# Patient Record
Sex: Male | Born: 1965 | Race: White | Hispanic: No | Marital: Single | Smoking: Never smoker
Health system: Southern US, Community
[De-identification: ages and names within clinical notes are randomized; demographics above are authoritative.]

## PROBLEM LIST (undated history)

## (undated) ENCOUNTER — Emergency Department: Payer: Self-pay | Source: Home / Self Care

## (undated) DIAGNOSIS — T1491XA Suicide attempt, initial encounter: Secondary | ICD-10-CM

## (undated) DIAGNOSIS — F319 Bipolar disorder, unspecified: Secondary | ICD-10-CM

## (undated) DIAGNOSIS — I10 Essential (primary) hypertension: Secondary | ICD-10-CM

## (undated) HISTORY — PX: LEG SURGERY: SHX1003

---

## 2004-07-27 ENCOUNTER — Emergency Department: Payer: Self-pay | Admitting: General Practice

## 2005-11-05 ENCOUNTER — Emergency Department: Payer: Self-pay

## 2013-11-21 LAB — CBC
HCT: 53.7 % — AB (ref 40.0–52.0)
HGB: 17.9 g/dL (ref 13.0–18.0)
MCH: 31.8 pg (ref 26.0–34.0)
MCHC: 33.3 g/dL (ref 32.0–36.0)
MCV: 96 fL (ref 80–100)
Platelet: 157 10*3/uL (ref 150–440)
RBC: 5.62 10*6/uL (ref 4.40–5.90)
RDW: 12.9 % (ref 11.5–14.5)
WBC: 7.5 10*3/uL (ref 3.8–10.6)

## 2013-11-21 LAB — COMPREHENSIVE METABOLIC PANEL
ALBUMIN: 4.2 g/dL (ref 3.4–5.0)
AST: 24 U/L (ref 15–37)
Alkaline Phosphatase: 88 U/L
Anion Gap: 6 — ABNORMAL LOW (ref 7–16)
BILIRUBIN TOTAL: 0.6 mg/dL (ref 0.2–1.0)
BUN: 16 mg/dL (ref 7–18)
CALCIUM: 8.7 mg/dL (ref 8.5–10.1)
CO2: 28 mmol/L (ref 21–32)
CREATININE: 0.95 mg/dL (ref 0.60–1.30)
Chloride: 105 mmol/L (ref 98–107)
EGFR (Non-African Amer.): >60
GLUCOSE: 109 mg/dL — AB (ref 65–99)
OSMOLALITY: 279 (ref 275–301)
Potassium: 4.2 mmol/L (ref 3.5–5.1)
SGPT (ALT): 41 U/L
Sodium: 139 mmol/L (ref 136–145)
Total Protein: 7.7 g/dL (ref 6.4–8.2)

## 2013-11-21 LAB — URINALYSIS, COMPLETE
BACTERIA: NONE SEEN
Bilirubin,UR: NEGATIVE
Blood: NEGATIVE
Glucose,UR: NEGATIVE mg/dL (ref 0–75)
KETONE: NEGATIVE
LEUKOCYTE ESTERASE: NEGATIVE
NITRITE: NEGATIVE
PH: 5 (ref 4.5–8.0)
Protein: NEGATIVE
RBC,UR: NONE SEEN /HPF (ref 0–5)
Specific Gravity: 1.024 (ref 1.003–1.030)
Squamous Epithelial: NONE SEEN
WBC UR: NONE SEEN /HPF (ref 0–5)

## 2013-11-21 LAB — DRUG SCREEN, URINE
Amphetamines, Ur Screen: NEGATIVE (ref ?–1000)
Barbiturates, Ur Screen: NEGATIVE (ref ?–200)
Benzodiazepine, Ur Scrn: POSITIVE (ref ?–200)
CANNABINOID 50 NG, UR ~~LOC~~: POSITIVE (ref ?–50)
COCAINE METABOLITE, UR ~~LOC~~: NEGATIVE (ref ?–300)
MDMA (Ecstasy)Ur Screen: NEGATIVE (ref ?–500)
Methadone, Ur Screen: NEGATIVE (ref ?–300)
OPIATE, UR SCREEN: NEGATIVE (ref ?–300)
PHENCYCLIDINE (PCP) UR S: NEGATIVE (ref ?–25)
Tricyclic, Ur Screen: NEGATIVE (ref ?–1000)

## 2013-11-21 LAB — ACETAMINOPHEN LEVEL: Acetaminophen: <2 ug/mL

## 2013-11-21 LAB — SALICYLATE LEVEL: Salicylates, Serum: 2.9 mg/dL — ABNORMAL HIGH

## 2013-11-21 LAB — ETHANOL

## 2013-11-21 LAB — CARBAMAZEPINE LEVEL, TOTAL: CARBAMAZEPINE: 7.2 ug/mL (ref 4.0–12.0)

## 2013-11-22 ENCOUNTER — Inpatient Hospital Stay: Payer: Self-pay | Admitting: Psychiatry

## 2013-11-26 LAB — CARBAMAZEPINE LEVEL, TOTAL: Carbamazepine: 6.8 ug/mL (ref 4.0–12.0)

## 2014-05-31 NOTE — Consult Note (Signed)
PATIENT NAME:  Collin Wilson, [name] MR#:  [id number] DATE OF BIRTH:  [birth year]  DATE OF CONSULTATION:  11/21/2013  REFERRING PHYSICIAN:   CONSULTING PHYSICIAN:  Audery AmelJohn T. Clapacs, MD  IDENTIFYING INFORMATION AND REASON FOR CONSULT: This is a 49 year old man with a history of bipolar disorder brought in and under evaluation because of an alleged suicidal threat on Facebook.    CHIEF COMPLAINT: "Everybody told me I should come in."   HISTORY OF PRESENT ILLNESS:  Information obtained from the patient and the chart. The patient tells me that his life sucks.  It has been sucking for a long time, but has been especially bad for the last 3 years when he lost his last job. It took him a year to find another one and when he did it was at a third of the pay of what he was doing before. He is living with his sister who he does not get along with. His mood is very depressed and has been particularly bad for the last month. He sleeps okay as long as he takes Valium at night. Appetite okay. Endorses suicidal ideation and feelings of hopelessness, no auditory or visual hallucinations. Says that he does take his Tegretol which is prescribed by [facility], but is not taking the rest of his medicines for his other medical problems. Acute stress is just generally hating his job. Denies that he abuses alcohol regularly, but admits he smokes marijuana frequently.   PAST PSYCHIATRIC HISTORY: Long-standing mood disorder. In 2007 he had a suicide attempt.  He characterizes it very clearly as being a successful suicide because he actually did hang himself and required resuscitation. He has been to [facility] at that time, but no further hospitalizations since then. He has been on several medicines in the past, but the Tegretol he thinks was reasonably helpful. He goes to [facility]. He does not describe any euphoric manic episodes.   SOCIAL HISTORY: Not married, no children, Had a  girlfriend, but she broke up with him because of his social situation. Does not get along with his sister, but it is the only place he can afford to live. Works as a Civil Service fast streamerdelivery driver which he finds humiliating and depressing.   PAST MEDICAL HISTORY: Says he has high blood pressure and high cholesterol, but does not take any medicine for it.   SUBSTANCE ABUSE HISTORY: Says he rarely uses alcohol. Denies other substance abuse, but then casually admits that of course that does not include the marijuana that he uses regularly.   FAMILY HISTORY: He says that his sister is a Chartered loss adjusterhoarder, but he does not know of anybody else with real clearly diagnosed psychiatric illness.   CURRENT MEDICATIONS: Tegretol 200 mg twice a day, Valium 5 mg at night at bedtime p.r.n.   ALLERGIES: No known drug allergies.   REVIEW OF SYSTEMS: Depressed mood, irritability, anxiety. Denies acute suicidal intent. Denies hallucinations. The rest of the physical review of systems is currently negative.   MENTAL STATUS EXAMINATION: A somewhat disheveled gentleman who looks his stated age. Passively cooperative. When he does cooperate he tends to be very dramatic in telling stories about how miserable his life is. Eye contact poor. Psychomotor activity slow. Speech is normal rate, tone, and volume. Affect is dramatic. Mood stated as depressed. Thoughts lucid. No loosening of associations or delusions. No evidence of auditory or visual hallucinations. Denies acute suicidal intent, but has chronic hopelessness. No homicidal ideation. He can remember  3 out of 3 objects immediately and at 3 minutes. He is of normal intelligence. He is alert and oriented x 4. Normal fund of knowledge.   VITAL SIGNS: Temperature 97.8, pulse 79, respirations 18, blood pressure 132/88.   LABORATORY RESULTS: Drug screen positive for cannabis and benzodiazepines. Urinalysis negative. Chemistry panel insignificantly elevated glucose at 109. Hematocrit elevated at 53,  otherwise normal CBC.   ASSESSMENT: A 49 year old man with diagnosis of bipolar disorder with long-standing depression. He made what was taken as a serious suicidal threat on Facebook, although he plays down the significance of it. He does have a history of a truly life threatening suicide attempt in the past. Does have multiple symptoms of depression. Requires hospital level treatment.   TREATMENT PLAN: Recommend admission to the psychiatric ward. No bed available at this time. Hopefully there will be a bed available tomorrow for admission. I have recommended that we look into referring him to outside hospitals in the meantime if a bed here is not available. Engage him in appropriate treatment when possible. Continue the Tegretol for now.  I am going to go ahead and file commitment paperwork on him to forestall the possibility of his wanting to walk out at some point given his dangerousness.   DIAGNOSIS PRINCIPAL AND PRIMARY:   AXIS I: Bipolar disorder, depressed.   SECONDARY DIAGNOSES:   AXIS I: No further.   AXIS II: Histrionic features.   AXIS III: History of high blood pressure.     ____________________________ Audery Amel, MD jtc:bu D: 11/21/2013 17:40:27 ET T: 11/21/2013 18:16:08 ET JOB#: [id number]  cc: Audery Amel, MD, <Dictator> Audery Amel MD ELECTRONICALLY SIGNED 11/27/2013 16:16

## 2014-05-31 NOTE — Discharge Summary (Signed)
PATIENT NAME:  Collin Wilson, Irwin A MR#:  [id number] DATE OF BIRTH:  06/16/1965  DATE OF ADMISSION:  11/22/2013 DATE OF DISCHARGE:  11/26/2013  IDENTIFYING INFORMATION:  The patient is a 49 year old Caucasian male who was brought in to the Emergency Department by his father.   CHIEF COMPLAINT: "Everybody thinks I need to come here"   HOSPITAL COURSE:  The patient presented to the Emergency Department after being told by the [facility] Sheriff that the patient needed evaluation. The patient said that he was at work and felt a if life was worthless.  He then put a message on the company's Facebook page suggesting he was depressed and suicidal.  He explained that he had a strain of bad luck. Mainly the reason for his current worsening depression is his income and his current job.  The patient feels bad as he has an associate degree in engineering and the job that he has now is a job that he believes should be given to somebody in high school.  He lost his job after his boss retired and passed the business onto somebody else.  The patient is currently living with his sister who is an alcoholic. The patient has a girlfriend but she recently broke up with him due to his financial problems. The patient did report suicidality at admission and reported symptoms consistent with a major depressive disorder.   The patient also reported a prior suicidal attempt in 2007 where he attempted to hang himself.  The patient actually reported that he was successful and had died.  He stated that his heart stopped beating and that he had a bowel movement during the hanging.  The patient is currently receiving [facility], psychiatric outpatient clinic where he is treated for bipolar disorder.   MEDICATIONS PRIOR TO ADMISSION:   Tegretol 200 mg b.i.d. and diazepam 1 tablet p.o. at bedtime.  In terms of substance abuse, the patient reported use of marijuana. The patient stated that prior to admission, he was  charged with marijuana possession, fictitious tag and he is due to for court soon.  The patient was started on citalopram.  The dose was titrated up to 40 mg p.o. daily as he has a prior history of a mood instability. He was continued on Tegretol 200 mg p.o. b.i.d.    This hospitalization was uneventful. The patient was pleasant and cooperative. He participated in groups.  There were no medical complications during his stay.  On the day of the discharge, the patient denied any suicidality, homicidality or psychosis. The patient denied any side effects from his medications and was no longer voicing suicidality.   DISCHARGE DIAGNOSES:  AXIS I: Major depressive disorder, moderate, recurrent, cannabis use disorder, history of bipolar disorder, hypertension, hypercholesterolemia, gastroesophageal reflux disease.   DISCHARGE MEDICATIONS:  Tegretol 200 mg p.o. b.i.d., citalopram 40 mg p.o. daily, hydrochlorothiazide   25 mg p.o. daily, lisinopril 20 mg p.o. daily, pantoprazole 40 mg p.o. daily, simvastatin 20 mg p.o. daily, trazodone 50 mg p.o. at bedtime for insomnia.      MENTAL STATUS EXAMINATION:  At the time of the discharge, the patient is alert and oriented.  He  displays good hygiene and grooming.  He is wearing a T-shirt and shorts, appears his stated age.  Behavior was calm, pleasant, and cooperative. Eye contact was within normal range. Speech had regular tone, volume, and rate. Thought process is linear and goal directed. Thought content is negative for suicidality, homicidality. Perception negative for  psychosis. Mood is euthymic.  His affect was reactive and bright. Insight is fair.  Judgment is fair.  Patient fund of knowledge appears to be average, attention and concentration appear to be grossly intact; however, there were not formally tested.  Language; the patient was able to follow instructions and repeat sentences without difficulties.   LABORATORY RESULTS:  BUN 16, creatinine 0.95, sodium  139, potassium 4.2. GFR greater than 60, calcium is 8.7. Alcohol was below detection limit. AST 24, ALT 41. Tegretol level 6.8. Urine toxicology screen was positive for benzodiazepines. The patient was prescribed with benzodiazepines prior to admission. Cannabis was also positive in the toxicology screen.  WBC 7.5, hemoglobin is 17.9, hematocrit 53.7. Urinalysis is clear.  Acetaminophen and salicylate levels were not abnormal.   DISCHARGE DISPOSITION:  The patient will be discharged back to home with his family.  DISCHARGE  FOLLOW-UP:  The patient will continue to follow up with [facility] [place]  He has an appointment there on 11/28/2013 at 9:00 a.m., telephone number is [phone].     ____________________________ Jimmy Footman, MD ahg:DT D: 11/26/2013 13:53:00 ET T: 11/26/2013 15:15:31 ET JOB#: [id number]  cc: Jimmy Footman, MD, <Dictator> Horton Chin MD ELECTRONICALLY SIGNED 11/28/2013 16:30
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
## 2015-06-13 ENCOUNTER — Emergency Department
Admission: EM | Admit: 2015-06-13 | Discharge: 2015-06-14 | Disposition: A | Discharge status: Other Acute Inpatient Hospital | Payer: Self-pay | Attending: Student | Admitting: Student

## 2015-06-13 ENCOUNTER — Encounter: Payer: Self-pay | Admitting: Emergency Medicine

## 2015-06-13 DIAGNOSIS — F32A Depression, unspecified: Secondary | ICD-10-CM

## 2015-06-13 DIAGNOSIS — F329 Major depressive disorder, single episode, unspecified: Secondary | ICD-10-CM | POA: Insufficient documentation

## 2015-06-13 DIAGNOSIS — I10 Essential (primary) hypertension: Secondary | ICD-10-CM | POA: Insufficient documentation

## 2015-06-13 DIAGNOSIS — F1290 Cannabis use, unspecified, uncomplicated: Secondary | ICD-10-CM | POA: Insufficient documentation

## 2015-06-13 HISTORY — DX: Suicide attempt, initial encounter: T14.91XA

## 2015-06-13 HISTORY — DX: Bipolar disorder, unspecified: F31.9

## 2015-06-13 HISTORY — DX: Essential (primary) hypertension: I10

## 2015-06-13 LAB — URINE DRUG SCREEN, QUALITATIVE (ARMC ONLY)
AMPHETAMINES, UR SCREEN: NOT DETECTED
BENZODIAZEPINE, UR SCRN: NOT DETECTED
Barbiturates, Ur Screen: NOT DETECTED
Cannabinoid 50 Ng, Ur ~~LOC~~: POSITIVE — AB
Cocaine Metabolite,Ur ~~LOC~~: NOT DETECTED
MDMA (Ecstasy)Ur Screen: NOT DETECTED
METHADONE SCREEN, URINE: NOT DETECTED
OPIATE, UR SCREEN: NOT DETECTED
Phencyclidine (PCP) Ur S: NOT DETECTED
Tricyclic, Ur Screen: NOT DETECTED

## 2015-06-13 LAB — CBC
HEMATOCRIT: 50.6 % (ref 40.0–52.0)
Hemoglobin: 17.8 g/dL (ref 13.0–18.0)
MCH: 32.2 pg (ref 26.0–34.0)
MCHC: 35.1 g/dL (ref 32.0–36.0)
MCV: 91.9 fL (ref 80.0–100.0)
PLATELETS: 178 10*3/uL (ref 150–440)
RBC: 5.51 MIL/uL (ref 4.40–5.90)
RDW: 13 % (ref 11.5–14.5)
WBC: 9.8 10*3/uL (ref 3.8–10.6)

## 2015-06-13 LAB — COMPREHENSIVE METABOLIC PANEL
ALBUMIN: 5.2 g/dL — AB (ref 3.5–5.0)
ALT: 44 U/L (ref 17–63)
ANION GAP: 13 (ref 5–15)
AST: 33 U/L (ref 15–41)
Alkaline Phosphatase: 85 U/L (ref 38–126)
BILIRUBIN TOTAL: 0.9 mg/dL (ref 0.3–1.2)
BUN: 18 mg/dL (ref 6–20)
CO2: 24 mmol/L (ref 22–32)
Calcium: 10 mg/dL (ref 8.9–10.3)
Chloride: 103 mmol/L (ref 101–111)
Creatinine, Ser: 1.07 mg/dL (ref 0.61–1.24)
GFR calc Af Amer: >60 mL/min (ref >60)
GFR calc non Af Amer: >60 mL/min (ref >60)
GLUCOSE: 156 mg/dL — AB (ref 65–99)
POTASSIUM: 3.7 mmol/L (ref 3.5–5.1)
SODIUM: 140 mmol/L (ref 135–145)
TOTAL PROTEIN: 8.4 g/dL — AB (ref 6.5–8.1)

## 2015-06-13 LAB — ETHANOL: Alcohol, Ethyl (B): <5 mg/dL (ref <5)

## 2015-06-13 LAB — SALICYLATE LEVEL: Salicylate Lvl: <4 mg/dL (ref 2.8–30.0)

## 2015-06-13 LAB — ACETAMINOPHEN LEVEL

## 2015-06-13 MED ORDER — ACETAMINOPHEN 500 MG PO TABS
1000.0000 mg | ORAL_TABLET | Freq: Once | ORAL | Status: AC
  Administered 2015-06-13: 1000 mg via ORAL

## 2015-06-13 MED ORDER — ACETAMINOPHEN 500 MG PO TABS
ORAL_TABLET | ORAL | Status: AC
  Administered 2015-06-13: 1000 mg via ORAL
  Filled 2015-06-13: qty 2

## 2015-06-13 MED ORDER — TRAZODONE HCL 50 MG PO TABS
50.0000 mg | ORAL_TABLET | Freq: Every day | ORAL | Status: DC
  Administered 2015-06-13 – 2015-06-14 (×2): 50 mg via ORAL
  Filled 2015-06-13 (×2): qty 1

## 2015-06-13 MED ORDER — TRAMADOL HCL 50 MG PO TABS
ORAL_TABLET | ORAL | Status: DC
Start: 2015-06-13 — End: 2015-06-13
  Filled 2015-06-13: qty 1

## 2015-06-13 MED ORDER — SODIUM CHLORIDE 0.9 % IV BOLUS (SEPSIS)
1000.0000 mL | Freq: Once | INTRAVENOUS | Status: AC
  Administered 2015-06-13: 1000 mL via INTRAVENOUS

## 2015-06-13 NOTE — ED Notes (Signed)
[facility] by Gerilyn PilgrimJacob EDT and Security

## 2015-06-13 NOTE — ED Notes (Signed)
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
## 2015-06-13 NOTE — ED Notes (Signed)
[facility] amb[facility] without difficulty. Patient is alert and oriented and in no apparent distress. Patient denies SI, HI, and AVH. Patient is calm and cooperative. Patient oriented to unit and made aware of security cameras and Q15 minute rounds. Patient encouraged to let Nursing staff know of any concerns or needs.

## 2015-06-13 NOTE — Progress Notes (Signed)
Report received from David, RN
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
## 2015-06-13 NOTE — ED Notes (Signed)
Pt denies any use of drugs and or alcohol at this time. Pt started on CIWA for heart rate elevated due to possible dehydration. Pt denies any shortness of breath and or chest pain. Pt put on cardiac monitor for cont monitoring of heart rate.
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
## 2015-06-13 NOTE — ED Notes (Signed)
Pt denies any thoughts of SI or HI to this RN at this time.

## 2015-06-14 ENCOUNTER — Encounter: Payer: Self-pay | Admitting: Psychiatry

## 2015-06-14 ENCOUNTER — Inpatient Hospital Stay
Admit: 2015-06-14 | Discharge: 2015-06-17 | DRG: 885 | Disposition: A | Discharge status: Home / Self Care | Payer: No Typology Code available for payment source | Source: Intra-hospital | Attending: Psychiatry | Admitting: Psychiatry

## 2015-06-14 DIAGNOSIS — F6089 Other specific personality disorders: Secondary | ICD-10-CM | POA: Diagnosis present

## 2015-06-14 DIAGNOSIS — I10 Essential (primary) hypertension: Secondary | ICD-10-CM

## 2015-06-14 DIAGNOSIS — R45851 Suicidal ideations: Secondary | ICD-10-CM | POA: Diagnosis present

## 2015-06-14 DIAGNOSIS — G4700 Insomnia, unspecified: Secondary | ICD-10-CM | POA: Diagnosis present

## 2015-06-14 DIAGNOSIS — F331 Major depressive disorder, recurrent, moderate: Secondary | ICD-10-CM | POA: Diagnosis present

## 2015-06-14 DIAGNOSIS — F319 Bipolar disorder, unspecified: Secondary | ICD-10-CM | POA: Diagnosis present

## 2015-06-14 DIAGNOSIS — F314 Bipolar disorder, current episode depressed, severe, without psychotic features: Secondary | ICD-10-CM

## 2015-06-14 DIAGNOSIS — F6081 Narcissistic personality disorder: Secondary | ICD-10-CM | POA: Diagnosis present

## 2015-06-14 DIAGNOSIS — F1220 Cannabis dependence, uncomplicated: Secondary | ICD-10-CM

## 2015-06-14 DIAGNOSIS — R296 Repeated falls: Secondary | ICD-10-CM | POA: Diagnosis present

## 2015-06-14 DIAGNOSIS — Z79899 Other long term (current) drug therapy: Secondary | ICD-10-CM

## 2015-06-14 DIAGNOSIS — Z9889 Other specified postprocedural states: Secondary | ICD-10-CM | POA: Diagnosis not present

## 2015-06-14 MED ORDER — IBUPROFEN 800 MG PO TABS
800.0000 mg | ORAL_TABLET | Freq: Once | ORAL | Status: AC
  Administered 2015-06-14: 800 mg via ORAL

## 2015-06-14 MED ORDER — IBUPROFEN 800 MG PO TABS
ORAL_TABLET | ORAL | Status: AC
  Administered 2015-06-14: 800 mg via ORAL
  Filled 2015-06-14: qty 1

## 2015-06-14 MED ORDER — CARBAMAZEPINE 200 MG PO TABS
200.0000 mg | ORAL_TABLET | Freq: Two times a day (BID) | ORAL | Status: DC
  Administered 2015-06-14: 200 mg via ORAL
  Filled 2015-06-14: qty 1

## 2015-06-14 MED ORDER — CITALOPRAM HYDROBROMIDE 20 MG PO TABS
40.0000 mg | ORAL_TABLET | Freq: Every day | ORAL | Status: DC
  Administered 2015-06-14: 40 mg via ORAL
  Filled 2015-06-14: qty 2

## 2015-06-14 NOTE — BH Assessment (Signed)
Per Dr. Jennet MaduroPucilowska patient meets criteria for inpatient hospitalizations.  Writer informed the Charge Nurse that the patient is in need of a bed.

## 2015-06-14 NOTE — ED Notes (Signed)
Patient resting quietly in room. No noted distress or abnormal behaviors noted. Will continue 15 minute checks and observation by security camera for safety. 

## 2015-06-14 NOTE — ED Notes (Addendum)
Patient remains asleep in room in no apparent distress. Patient slept the majority of the shift with no behavioral issues or complaints voiced.Q15 minutes checks and safety maintained.Will continue to monitor.

## 2015-06-14 NOTE — ED Notes (Signed)
Sandwich tray and milk given.

## 2015-06-14 NOTE — ED Notes (Signed)
Patient awake, alert, and oriented. He states he is under stress because of issues living with his mother and it was either "hang myself" or come to the hospital. He currently denies SI. Patient states he has been on Celexa for depression but it "is not working." He is unclear of what he wants for treatment - he "just needs to get a job and get away from my mother."  Maintained on 15 minute checks and observation by security camera for safety.

## 2015-06-14 NOTE — Progress Notes (Signed)
Report given to oncoming shift.
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
## 2015-06-14 NOTE — ED Notes (Signed)
Patient met with psychiatrist.  Maintained on 15 minute checks and observation by security camera for safety. 

## 2015-06-14 NOTE — Progress Notes (Signed)
Report given to Baird Lyonsasey, RN [facility])

## 2015-06-14 NOTE — ED Notes (Signed)
Patient is alert and oriented, warm and dry,and in no apparent distress. Patient.denies SI, HI, and AVH. Patient is currently sitting in his room watching television. Patient encouraged to let nursing staff know of any concerns or needs.

## 2015-06-14 NOTE — ED Notes (Signed)

## 2015-06-14 NOTE — ED Provider Notes (Signed)
-----------------------------------------   7:22 AM on 06/14/2015 -----------------------------------------   Blood pressure 153/93, pulse 106, temperature 98.4 F (36.9 C), temperature source Oral, resp. rate 20, height 6\' 1"  (1.854 m), weight 290 lb (131.543 kg), SpO2 99 %.  The patient had no acute events since last update.  Calm and cooperative at this time.    The patient is still waiting to see psych.     Rebecka ApleyAllison P Veroncia Jezek, MD 06/14/15 [id number]
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
## 2015-06-15 DIAGNOSIS — F1220 Cannabis dependence, uncomplicated: Secondary | ICD-10-CM

## 2015-06-15 DIAGNOSIS — F331 Major depressive disorder, recurrent, moderate: Secondary | ICD-10-CM

## 2015-06-15 DIAGNOSIS — I10 Essential (primary) hypertension: Secondary | ICD-10-CM

## 2015-06-15 DIAGNOSIS — F6089 Other specific personality disorders: Secondary | ICD-10-CM

## 2015-06-15 MED ORDER — CLONIDINE HCL 0.1 MG PO TABS
0.1000 mg | ORAL_TABLET | Freq: Every day | ORAL | Status: DC | PRN
  Administered 2015-06-15: 0.1 mg via ORAL
  Filled 2015-06-15 (×3): qty 1

## 2015-06-15 MED ORDER — TRAZODONE HCL 100 MG PO TABS
100.0000 mg | ORAL_TABLET | Freq: Every day | ORAL | Status: DC
  Administered 2015-06-15 – 2015-06-16 (×2): 100 mg via ORAL
  Filled 2015-06-15 (×4): qty 1

## 2015-06-15 MED ORDER — ATENOLOL 25 MG PO TABS
12.5000 mg | ORAL_TABLET | Freq: Every day | ORAL | Status: DC
  Administered 2015-06-15 – 2015-06-17 (×3): 12.5 mg via ORAL
  Filled 2015-06-15 (×3): qty 1

## 2015-06-15 MED ORDER — ACETAMINOPHEN 325 MG PO TABS: 650.0000 mg | ORAL_TABLET | Freq: Four times a day (QID) | ORAL | Status: DC | PRN

## 2015-06-15 MED ORDER — CLONIDINE HCL 0.1 MG PO TABS
0.1000 mg | ORAL_TABLET | Freq: Once | ORAL | Status: AC
  Administered 2015-06-15: 0.1 mg via ORAL
  Filled 2015-06-15: qty 1

## 2015-06-15 MED ORDER — ALUM & MAG HYDROXIDE-SIMETH 200-200-20 MG/5ML PO SUSP: 30.0000 mL | ORAL | Status: DC | PRN

## 2015-06-15 MED ORDER — ASPIRIN EC 325 MG PO TBEC
325.0000 mg | DELAYED_RELEASE_TABLET | Freq: Four times a day (QID) | ORAL | Status: DC | PRN
  Administered 2015-06-15: 325 mg via ORAL
  Filled 2015-06-15 (×2): qty 1

## 2015-06-15 MED ORDER — CARBAMAZEPINE 200 MG PO TABS
200.0000 mg | ORAL_TABLET | Freq: Two times a day (BID) | ORAL | Status: DC
  Administered 2015-06-15 – 2015-06-17 (×5): 200 mg via ORAL
  Filled 2015-06-15 (×5): qty 1

## 2015-06-15 MED ORDER — MAGNESIUM HYDROXIDE 400 MG/5ML PO SUSP: 30.0000 mL | Freq: Every day | ORAL | Status: DC | PRN

## 2015-06-15 MED ORDER — CITALOPRAM HYDROBROMIDE 20 MG PO TABS
40.0000 mg | ORAL_TABLET | Freq: Every day | ORAL | Status: DC
  Administered 2015-06-15 – 2015-06-17 (×3): 40 mg via ORAL
  Filled 2015-06-15 (×3): qty 2
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
## 2015-06-15 NOTE — Tx Team (Signed)
Initial Interdisciplinary Treatment Plan   PATIENT STRESSORS: Financial difficulties Marital or family conflict Occupational concerns   PATIENT STRENGTHS: General fund of knowledge Motivation for treatment/growth   PROBLEM LIST: Problem List/Patient Goals Date to be addressed Date deferred Reason deferred Estimated date of resolution  Suicidal Ideations  06/15/15     "I knew if I didn't leave I was going to hurt myself." 06/15/15     Hopelessness  06/15/15     Homeless  06/15/15                                    DISCHARGE CRITERIA:  Improved stabilization in mood, thinking, and/or behavior  PRELIMINARY DISCHARGE PLAN: Outpatient therapy  PATIENT/FAMIILY INVOLVEMENT: This treatment plan has been presented to and reviewed with the patient, Collin Wilson, and/or family member.  The patient and family have been given the opportunity to ask questions and make suggestions.  Collin Wilson 06/15/2015, 12:53 AM

## 2015-06-15 NOTE — Plan of Care (Signed)
Problem: Cons[facility] Patient Education Outcome: Progressing Patient cooperates with unit schedule , reinforced the need to go to group  [place]
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
## 2015-06-15 NOTE — Progress Notes (Signed)
Recreation Therapy Notes  Date: 05.08.17 Time: 1:00 pm Location: Craft Room  Group Topic: Wellness  Goal Area(s) Addresses:  Patient will identify at least one item per dimension of health. Patient will examine areas they are deficient in.  Behavioral Response: Attentive, Interactive  Intervention: 6 Dimensions of Health  Activity: Patients were given a definition worksheet on the 6 Dimensions of Health and a worksheet with each dimension on it. Patients were instructed to read over the definition sheet and were encouraged to write 2-3 things they were currently doing in each dimension.  Education: LRT educated group on ways to increase each dimension.  Education Outcome: Acknowledges education/In group clarification offered   Clinical Observations/Feedback: Patient completed activity by writing at least 2 items in each category. Patient contributed to group discussion by stating what area he was giving enough attention to, what area he was not giving enough attention to, some things he could do to increase certain areas, how this activity relates to his admission, and how this activity relates to his admission.  Jacquelynn CreeGreene,Daman Steffenhagen M, LRT/CTRS 06/15/2015 2:35 PM
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
## 2015-06-15 NOTE — Progress Notes (Signed)
D:  Per pt self inventory pt reports sleeping good, appetite good, energy level normal, ability to pay attention good, rates depression at a 2 out of 10, hopelessness at a 5 out of 10, anxiety at a 2 out of 10, denies SI/HI/AVH, flat/depressed during interaction.     A:  Emotional support provided, Encouraged pt to continue with treatment plan and attend all group activities, q15 min checks maintained for safety.  R:  Pt is receptive, going to groups, pleasant and cooperative with staff and other patients on the unit.

## 2015-06-15 NOTE — [facility] Group Notes (Signed)
[facility] Group Notes:  (Nursing/MHT/Case Management/Adjunct)  Date:  06/15/2015  Time:  4:20 PM  Type of Therapy:  Psychoeducational Skills  Participation Level:  Active  Participation Quality:  Attentive  Affect:  Appropriate  Cognitive:  Appropriate  Insight:  Good  Engagement in Group:  Engaged  Modes of Intervention:  Support  Summary of Progress/Problems:  Collin Wilson 06/15/2015, 4:20 PM

## 2015-06-15 NOTE — [facility] Group Notes (Addendum)
[facility] LCSW Group Therapy  06/15/2015 10:55 AM  Type of Therapy:  Group Therapy  Participation Level:  Active  Participation Quality:  Appropriate and Attentive  Affect:  Appropriate  Cognitive:  Alert, Appropriate and Oriented  Insight:  Engaged  Engagement in Therapy:  Engaged and Supportive  Modes of Intervention:  Discussion, Education, Socialization and Support  Summary of Progress/Problems: Patient attended and participated in group discussion appropriately. Patient introduced himself and shared during an introductory exercise that if he were an animal, he would be a "cat because they are mellow, self-sufficient, social when they want to be and alone when they want to be". Patient didn't share much about his own obstacle in group but was able to offer much support to other group members and has much insight and shared his experience using music, meditation, and mindfulness to cope with anger and other negative emotions.   Lulu RidingIngle, Loveah Like T, MSW, LCSW 06/15/2015, 10:55 AM

## 2015-06-15 NOTE — Progress Notes (Signed)
D: Patient is alert and oriented on the unit this shift. Patient does not attend and notactively participated in groups today. Patient denies suicidal ideation, homicidal ideation, auditory or visual hallucinations at the present time.  A: Scheduled medications are administered to patient as per MD orders. Emotional support and encouragement are provided. Patient is maintained on q.15 minute safety checks. Patient is informed to notify staff with questions or concerns. R: No adverse medication reactions are noted. Patient is cooperative with medication administration and treatment plan today. Patient is receptive, calm and cooperative on the unit at this time. Patient does not interact with others on the unit this shift. Patient contracts for safety at this time. Patient remains safe at this time.

## 2015-06-16 MED ORDER — CARBAMAZEPINE 200 MG PO TABS: 200.0000 mg | ORAL_TABLET | Freq: Two times a day (BID) | ORAL | Status: DC

## 2015-06-16 MED ORDER — TRAZODONE HCL 100 MG PO TABS: 100.0000 mg | ORAL_TABLET | Freq: Every day | ORAL | Status: DC

## 2015-06-16 MED ORDER — CITALOPRAM HYDROBROMIDE 40 MG PO TABS: 40.0000 mg | ORAL_TABLET | Freq: Every day | ORAL | Status: DC

## 2015-06-16 NOTE — [facility] Group Notes (Signed)
[facility] Group Notes:  (Nursing/MHT/Case Management/Adjunct)  Date:  06/16/2015  Time:  2:23 PM  Type of Therapy:  Psychoeducational Skills  Participation Level:  Active  Participation Quality:  Appropriate  Affect:  Appropriate  Cognitive:  Appropriate  Insight:  Appropriate  Engagement in Group:  Engaged  Modes of Intervention:  Discussion and Education  Summary of Progress/Problems:  Mickey Farberamela M Moses Ellison 06/16/2015, 2:23 PM
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
## 2015-06-16 NOTE — Progress Notes (Signed)
D: Patient stated slept good last night .Stated appetite is good and energy level  Is normal. Stated concentration is good . Stated on Depression scale 2 , hopeless 5 and anxiety 2 .( low 0-10 high) Denies suicidal  homicidal ideations  .  No auditory hallucinations  No pain concerns . Appropriate ADL'S. Interacting with peers and staff. Patient would not set a goal A: Encourage patient participation with unit programming . Instruction  Given on  Medication , verbalize understanding. R: Voice no other concerns. Staff continue to monitor

## 2015-06-16 NOTE — Plan of Care (Signed)
Problem: [facility] Participation in Recreation Therapeutic Interventions Goal: STG-Patient will demonstrate improved self esteem by identif STG: Self-Esteem - Within 3 treatment sessions, patient will verbalize at least 5 positive affirmation statements in one treatment session to increase self-esteem post d/c.  Outcome: Completed/Met Date Met:  06/16/15 Treatment Session 1; Completed 1 out of 1: At approximately 1:00 pm, LRT met with patient in patient room. Patient verbalized 5 positive affirmation statements. Patient reported it felt "pretty good". LRT encouraged patient to continue saying positive affirmation statements.  Leonette Monarch, LRT/CTRS 05.09.17 2:19 pm Goal: STG-Other Recreation Therapy Goal (Specify) STG: Stress Management - Within 3 treatment sessions, patient will verbalize understanding of the stress management techniques in one treatment session to increase stress management skills post d/c.  Outcome: Completed/Met Date Met:  06/16/15 Treatment Session 1; Completed 1 out of 1: At approximately 1:00 pm, LRT met with patient in patient room. LRT educated and provided patient with handouts on stress management techniques. Patient verbalized understanding. LRT encouraged patient to read over and practice the stress management techniques.  Leonette Monarch, LRT/CTRS 05.09.17 2:22 pm

## 2015-06-16 NOTE — [facility] Group Notes (Signed)
[facility]   06/16/2015 11:33 AM  Participation Quality:  Patient attended and participated in group discussion introducing himself and sharing that his SMART goal is to "plan what I'm gonna do when I get out of the hospital". Patient was given a daily workbook on Recovery and ensured patient was informed of LOS, rules for milieu, roles of staff, and ensured patient was aware of their psychiatrist and social worker names.   Mood/Affect:  Appropriate  Depression Rating:  2  Anxiety Rating:  2  Thoughts of Suicide:  No Will you contract for safety?   Yes  Current AVH:  No  Plan for Discharge/Comments:  Needs to make arrangements  Transportation Means: needs assistance   Lulu RidingIngle, Claudine Stallings T, MSW, LCSW
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
## 2015-06-16 NOTE — Progress Notes (Signed)
Recreation Therapy Notes  Date: 05.09.17 Time: 3:00 pm Location: Craft Room  Group Topic: Self-expression  Goal Area(s) Addresses:  Patient will be able to identify a color that represents each emotion. Patient will verbalize benefit of using art as a means of self-expression. Patient will verbalize one emotion experienced while participating in activity.  Behavioral Response: Attentive, Interactive  Intervention: The Colors Within Me  Activity: Patients were given a blank face worksheet and instructed to pick a color for each emotion they were experiencing and to show on the face how much of that emotion they were feeling.  Education: LRT educated patients on other forms of self-expression.  Education Outcome: Acknowledges education/In group clarification offered   Clinical Observations/Feedback: At the beginning of group, LRT explained the theme of the day was Recovery. Patient got off topic and talked about how his family smokes cigarettes and his sister is an alcoholic. LRT encouraged patient to focus on his own recovery. Patient talked about a movie. Patient completed activity by picking a color for each emotion he was experiencing and showing how much of that emotion he was experiencing. Patient was talked about cars before LRT started group discussion. Patient contributed to group discussion by sharing what emotions he was feeling, that his emotions are dynamic, and that it was helpful to see his emotions on paper.   Jacquelynn CreeGreene,Collin Wilson M, LRT/CTRS 06/16/2015 4:24 PM

## 2015-06-16 NOTE — Progress Notes (Signed)
Recreation Therapy Notes  INPATIENT RECREATION TR PLAN  Patient Details Name: Collin Wilson MRN: [id number] DOB: 1965-12-01 Today's Date: 06/16/2015  Rec Therapy Plan Is patient appropriate for Therapeutic Recreation?: Yes Treatment times per week: At least once a week TR Treatment/Interventions: 1:1 session, Group participation (Comment) (Appropriate participation in daily recreation therapy tx)  Discharge Criteria Pt will be discharged from therapy if:: Treatment goals are met, Discharged Treatment plan/goals/alternatives discussed and agreed upon by:: Patient/family  Discharge Summary Short term goals set: See Care Plan Short term goals met: Complete Progress toward goals comments: One-to-one attended Which groups?: Wellness, Other (Comment) (Self-expression) One-to-one attended: Self-esteem, stress management Reason goals not met: N/A Therapeutic equipment acquired: None Reason patient discharged from therapy: Treatment goals met Pt/family agrees with progress & goals achieved: Yes Date patient discharged from therapy: 06/16/15   Leonette Monarch, LRT/CTRS 06/16/2015, 4:31 PM

## 2015-06-16 NOTE — Plan of Care (Signed)
Problem: Ineffective individual coping Goal: LTG: Patient will report a decrease in negative feelings Outcome: Not Progressing Patient  Attending unit programing

## 2015-06-17 LAB — CARBAMAZEPINE LEVEL, TOTAL: CARBAMAZEPINE LVL: 4.5 ug/mL (ref 4.0–12.0)

## 2015-06-17 MED ORDER — ATENOLOL 25 MG PO TABS: 12.5000 mg | ORAL_TABLET | Freq: Every day | ORAL | Status: DC

## 2015-06-17 NOTE — Plan of Care (Signed)
Problem: Ineffective individual coping Goal: STG: Patient will remain free from self harm Outcome: Progressing Medications administered as ordered by the physician, medications Therapeutic Effects, SEs and Adverse effects discussed, questions encouraged; no PRN given, 15 minute checks maintained for safety, clinical and moral support provided, patient encouraged to continue to express feelings and demonstrate safe care. Patient remain free from harm, will continue to monitor.         

## 2015-06-17 NOTE — [facility] Suicide Risk Assessment (Signed)
[facility]   Principal Problem: Major depressive disorder, recurrent episode, moderate (HCC) Discharge Diagnoses:  Patient Active Problem List   Diagnosis Date Noted  . Major depressive disorder, recurrent episode, moderate (HCC) [F33.1] 06/15/2015  . 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 HTN (hypertension) [I10] 06/15/2015  . Cannabis use disorder, severe, dependence (HCC) [F12.20] 06/15/2015    Total Time spent with patient: 30 minutes    Psychiatric Specialty Exam: ROS                                                         Mental Status Per Nursing Assessment::   On Admission:  Suicidal ideation indicated by patient, Self-harm thoughts  Demographic Factors:  Male, Caucasian, Low socioeconomic status, Living alone and Unemployed  Loss Factors: Financial problems/change in socioeconomic status  Historical Factors: Prior suicide attempts  Risk Reduction Factors:   Positive coping skills or problem solving skills  Continued Clinical Symptoms:  Alcohol/Substance Abuse/Dependencies Personality Disorders:   Cluster B Previous Psychiatric Diagnoses and Treatments  Cognitive Features That Contribute To Risk:  None    Suicide Risk:  Minimal: No identifiable suicidal ideation.  Patients presenting with no risk factors but with morbid ruminations; may be classified as minimal risk based on the severity of the depressive symptoms     Collin Wilson,  Collin Solum, MD 06/17/2015, 7:05 AM
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
## 2015-06-17 NOTE — [facility] Group Notes (Signed)
[facility] LCSW Group Therapy  06/17/2015 11:09 AM  Type of Therapy:  Group Therapy  Participation Level:  Did Not Attend  Summary of Progress/Problems: Patient was called but did not attend morning group.   Stanislawa Gaffin T, MSW, LCSW 06/17/2015, 11:09 AM 

## 2015-06-17 NOTE — [facility] Suicide Risk Assessment (Signed)
[facility] INPATIENT:  Family/Significant Other Suicide Prevention Education  Suicide Prevention Education:  Education Completed; Collin AnisPatricia Oakley, Mother,  (name of family member/significant other) has been identified by the patient as the family member/significant other with whom the patient will be residing, and identified as the person(s) who will aid the patient in the event of a mental health crisis (suicidal ideations/suicide attempt).  With written consent from the patient, the family member/significant other has been provided the following suicide prevention education, prior to the and/or following the discharge of the patient.  The suicide prevention education provided includes the following:  Suicide risk factors  Suicide prevention and interventions  National Suicide Hotline telephone number  [facility] assessment telephone number  [facility] Emergency Assistance 911  [facility] and/or Residential Mobile Crisis Unit telephone number  Request made of family/significant other to:  Remove weapons (e.g., guns, rifles, knives), all items previously/currently identified as safety concern.    Remove drugs/medications (over-the-counter, prescriptions, illicit drugs), all items previously/currently identified as a safety concern.  The family member/significant other verbalizes understanding of the suicide prevention education information provided.  The family member/significant other agrees to remove the items of safety concern listed above.  *Pt will not be returning to live with his mom, she was agreeable to assist him in crisis and to continue to transport him to doctor's appointments so that he remains on medications.  Glennon MacLaws, Castin Donaghue P, MSW, LCSW 06/17/2015, 2:49 PM

## 2015-06-17 NOTE — [facility] Group Notes (Signed)
[facility] LCSW Group Therapy  06/17/2015 2:39 PM  Type of Therapy:  Group Therapy  Participation Level:  Did Not Attend  Summary of Progress/Problems:  CSW called group for a movie, "The Happy Movie" which is a documentary about what truly makes people happy and how to measure happiness. Patient did not attend the afternoon group.   Lulu RidingIngle, Ashni Lonzo T, MSW, LCSW 06/17/2015, 2:39 PM

## 2015-06-17 NOTE — Progress Notes (Signed)
Awake in room, A&Ox3, c/o chronic right leg pain 2/10 "I don't need medication for pain because MJ is the only thing that can relief my pain." Pt denied suicidal thoughts "I just don't want to think right now; Dr. told me that I will be discharge tomorrow whether I like it or not ..." Will continue to monitor.
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
## 2015-06-17 NOTE — Progress Notes (Signed)
  [facility] Adult Case Management Discharge Plan :  Will you be returning to the same living situation after discharge:  No.  PT unable to return to mother, or father.  Can go to sister's but declines to do so.  Message left with Thadea[facility] at [facility]  At discharge, do you have transportation home?: No.  Pt provided Link bus ticket to get to [facility] for intake Do you have the ability to pay for your medications: Yes,  has assistance through [facility]  Release of information consent forms completed and in the chart;  Patient's signature needed at discharge.  Patient to Follow up at: Follow-up Information    [facility] Go on 07/30/2015.   Why:  1:00pm, , Hospital Follow up, Outpatient Medication Management, Therapy, Please reschedule if unable to make appointment.   Contact information:   [address] [place], [place] [address] [phone] Fax: [phone]      Next level of care provider has [facility] Link:no  Safety Planning and Suicide Prevention discussed: Yes,     Have you used any form of tobacco in the last 30 days? (Cigarettes, Smokeless Tobacco, Cigars, and/or Pipes): No  Has patient been referred to the Quitline?: N/A patient is not a smoker  Patient has been referred for addiction treatment: Yes  Isaura Schiller, Cleda DaubSara P, MSW, LCSW 06/17/2015, 2:54 PM

## 2015-06-17 NOTE — Progress Notes (Signed)
D:Patient aware of discharge this shift . Patient returning home . Patient received all belonging locked up . Patient denies  Suicidal  And homicidal ideations  .  A: Writer instructed on discharge criteria  . Informed of   AVS, Transitional Record, Suicidal  Risk Assessment and prescriptions  given to patient . Aware  Of follow up appointment . R: Patient left unit with no questions  Or concerns  To bus stop
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
## 2015-06-22 ENCOUNTER — Ambulatory Visit: Payer: No Typology Code available for payment source

## 2016-12-28 ENCOUNTER — Telehealth: Payer: Self-pay | Admitting: Pharmacy Technician

## 2016-12-28 NOTE — Telephone Encounter (Signed)
Patient failed to provide current poi for 2018.  No additional medication assistance will be provided by [facility] without the required proof of income documentation.  Patient notified by letter.  Betty J. Kluttz Care Manager [facility] 

## 2019-05-31 ENCOUNTER — Other Ambulatory Visit: Payer: Self-pay

## 2019-05-31 ENCOUNTER — Ambulatory Visit: Payer: Self-pay | Attending: Internal Medicine

## 2019-05-31 DIAGNOSIS — Z23 Encounter for immunization: Secondary | ICD-10-CM

## 2019-05-31 NOTE — Progress Notes (Signed)
   Covid-19 Vaccination Clinic  Name:  ZALAN SHIDLER    MRN: [id number] DOB: 11-16-1965  05/31/2019  Mr. Randle was observed post Covid-19 immunization for 15 minutes without incident. He was provided with Vaccine Information Sheet and instruction to access the V-Safe system.   Mr. Hegner was instructed to call 911 with any severe reactions post vaccine: Marland Kitchen Difficulty breathing  . Swelling of face and throat  . A fast heartbeat  . A bad rash all over body  . Dizziness and weakness   Immunizations Administered    Name Date Dose VIS Date Route   Pfizer COVID-19 Vaccine 05/31/2019 11:29 AM 0.3 mL 04/03/2018 Intramuscular   Manufacturer: [employer], [employer]   Lot: [id number]   NDC: [address]-2

## 2019-06-25 ENCOUNTER — Ambulatory Visit: Payer: Self-pay | Attending: Internal Medicine

## 2020-04-10 ENCOUNTER — Other Ambulatory Visit: Payer: Self-pay

## 2020-04-10 ENCOUNTER — Emergency Department
Admission: EM | Admit: 2020-04-10 | Discharge: 2020-04-10 | Disposition: A | Discharge status: Home / Self Care | Payer: Self-pay | Attending: Emergency Medicine | Admitting: Emergency Medicine

## 2020-04-10 ENCOUNTER — Emergency Department: Payer: Self-pay

## 2020-04-10 DIAGNOSIS — K219 Gastro-esophageal reflux disease without esophagitis: Secondary | ICD-10-CM | POA: Insufficient documentation

## 2020-04-10 DIAGNOSIS — I10 Essential (primary) hypertension: Secondary | ICD-10-CM | POA: Insufficient documentation

## 2020-04-10 DIAGNOSIS — R0789 Other chest pain: Secondary | ICD-10-CM | POA: Insufficient documentation

## 2020-04-10 LAB — CBC
HCT: 45.1 % (ref 39.0–52.0)
Hemoglobin: 15.8 g/dL (ref 13.0–17.0)
MCH: 32 pg (ref 26.0–34.0)
MCHC: 35 g/dL (ref 30.0–36.0)
MCV: 91.3 fL (ref 80.0–100.0)
Platelets: 151 10*3/uL (ref 150–400)
RBC: 4.94 MIL/uL (ref 4.22–5.81)
RDW: 12.7 % (ref 11.5–15.5)
WBC: 6.9 10*3/uL (ref 4.0–10.5)
nRBC: 0 % (ref 0.0–0.2)

## 2020-04-10 LAB — BASIC METABOLIC PANEL
Anion gap: 8 (ref 5–15)
BUN: 18 mg/dL (ref 6–20)
CO2: 27 mmol/L (ref 22–32)
Calcium: 8.8 mg/dL — ABNORMAL LOW (ref 8.9–10.3)
Chloride: 105 mmol/L (ref 98–111)
Creatinine, Ser: 0.76 mg/dL (ref 0.61–1.24)
GFR, Estimated: >60 mL/min (ref >60)
Glucose, Bld: 115 mg/dL — ABNORMAL HIGH (ref 70–99)
Potassium: 4 mmol/L (ref 3.5–5.1)
Sodium: 140 mmol/L (ref 135–145)

## 2020-04-10 LAB — TROPONIN I (HIGH SENSITIVITY)
Troponin I (High Sensitivity): 5 ng/L (ref <18)
Troponin I (High Sensitivity): 5 ng/L (ref <18)

## 2020-04-10 MED ORDER — FAMOTIDINE 20 MG PO TABS
20.0000 mg | ORAL_TABLET | Freq: Once | ORAL | Status: AC
  Administered 2020-04-10: 20 mg via ORAL
  Filled 2020-04-10: qty 1

## 2020-04-10 MED ORDER — LIDOCAINE VISCOUS HCL 2 % MT SOLN
15.0000 mL | Freq: Once | OROMUCOSAL | Status: AC
  Administered 2020-04-10: 15 mL via OROMUCOSAL
  Filled 2020-04-10: qty 15

## 2020-04-10 MED ORDER — ALUM & MAG HYDROXIDE-SIMETH 200-200-20 MG/5ML PO SUSP
30.0000 mL | Freq: Once | ORAL | Status: AC
  Administered 2020-04-10: 30 mL via ORAL
  Filled 2020-04-10: qty 30

## 2020-04-10 MED ORDER — OMEPRAZOLE 40 MG PO CPDR: 40.0000 mg | DELAYED_RELEASE_CAPSULE | Freq: Every day | ORAL | 0 refills | Status: DC

## 2020-04-10 MED ORDER — SUCRALFATE 1 G PO TABS: 1.0000 g | ORAL_TABLET | Freq: Four times a day (QID) | ORAL | 0 refills | Status: DC | PRN

## 2020-04-10 NOTE — ED Provider Notes (Signed)
[facility] Emergency Department Provider Note ____________________________________________   Event Date/Time   First MD Initiated Contact with Patient 04/10/20 1010     (approximate)  I have reviewed the triage vital signs and the nursing notes.   HISTORY  Chief Complaint Chest Pain and Abdominal Pain    HPI Collin Wilson is a 55 y.o. male with PMH as noted below including hypertension but no prior cardiac history who presents with chest pain, acute onset around 3 AM, intermittent since then, described as a squeezing sensation.  It is mainly substernal.  It is associated with nausea.  The patient states that he chewed an aspirin and took a nitroglycerin he got from his stepfather.  The pain improved somewhat after this but then returned.  He states that he has had similar chest pain once in the past which he was told was related to hiatal hernia.  Past Medical History:  Diagnosis Date  . Bipolar 1 disorder (HCC)   . Hypertension   . Suicide attempt [facility])     Patient Active Problem List   Diagnosis Date Noted  . Major depressive disorder, recurrent episode, moderate (HCC) 06/15/2015  . Cluster B personality disorder (HCC) 06/15/2015  . HTN (hypertension) 06/15/2015  . Cannabis use disorder, severe, dependence (HCC) 06/15/2015    Past Surgical History:  Procedure Laterality Date  . LEG SURGERY      Prior to Admission medications   Medication Sig Start Date End Date Taking? Authorizing Provider  atenolol (TENORMIN) 25 MG tablet Take 0.5 tablets (12.5 mg total) by mouth daily. 06/17/15   Jimmy Footman, MD  carbamazepine (TEGRETOL) 200 MG tablet Take 1 tablet (200 mg total) by mouth 2 (two) times daily at 8 am and 10 pm. 06/16/15   Jimmy Footman, MD  citalopram (CELEXA) 40 MG tablet Take 1 tablet (40 mg total) by mouth daily. 06/16/15   Jimmy Footman, MD  traZODone (DESYREL) 100 MG tablet Take 1 tablet (100 mg  total) by mouth at bedtime. 06/16/15   Jimmy Footman, MD    Allergies Patient has no known allergies.  History reviewed. No pertinent family history.  Social History Social History   Tobacco Use  . Smoking status: Never Smoker  . Smokeless tobacco: Never Used  Substance Use Topics  . Alcohol use: No  . Drug use: Yes    Types: Marijuana    Review of Systems  Constitutional: No fever/chills. Eyes: No visual changes. ENT: No sore throat. Cardiovascular: Positive for chest pain. Respiratory: Denies shortness of breath. Gastrointestinal: Positive for nausea. Genitourinary: Negative for dysuria.  Musculoskeletal: Negative for back pain. Skin: Negative for rash. Neurological: Negative for headache.   ____________________________________________   PHYSICAL EXAM:  VITAL SIGNS: ED Triage Vitals  Enc Vitals Group     BP --      Pulse Rate 04/10/20 1004 81     Resp 04/10/20 1004 13     Temp 04/10/20 1004 98.9 F (37.2 C)     Temp Source 04/10/20 1004 Oral     SpO2 04/10/20 1004 97 %     Weight 04/10/20 1005 290 lb (131.5 kg)     Height 04/10/20 1005 6\' 2"  (1.88 m)     Head Circumference --      Peak Flow --      Pain Score 04/10/20 1005 4     Pain Loc --      Pain Edu? --      Excl. in GC? --  Constitutional: Alert and oriented. Well appearing and in no acute distress. Eyes: Conjunctivae are normal.  Head: Atraumatic. Nose: No congestion/rhinnorhea. Mouth/Throat: Mucous membranes are moist.   Neck: Normal range of motion.  Cardiovascular: Normal rate, regular rhythm. Grossly normal heart sounds.  Good peripheral circulation. Respiratory: Normal respiratory effort.  No retractions. Lungs CTAB. Gastrointestinal: Soft and nontender. No distention.  Musculoskeletal: No lower extremity edema.  Extremities warm and well perfused.  Neurologic:  Normal speech and language. No gross focal neurologic deficits are appreciated.  Skin:  Skin is warm and dry.  No rash noted. Psychiatric: Mood and affect are normal. Speech and behavior are normal.  ____________________________________________   LABS (all labs ordered are listed, but only abnormal results are displayed)  Labs Reviewed  BASIC METABOLIC PANEL - Abnormal; Notable for the following components:      Result Value   Glucose, Bld 115 (*)    Calcium 8.8 (*)    All other components within normal limits  CBC  TROPONIN I (HIGH SENSITIVITY)  TROPONIN I (HIGH SENSITIVITY)   ____________________________________________  EKG  ED ECG REPORT I, Dionne Bucy, the attending physician, personally viewed and interpreted this ECG.  Date: 04/10/2020 EKG Time: 0959 Rate: 82 Rhythm: normal sinus rhythm QRS Axis: normal Intervals: normal ST/T Wave abnormalities: normal Narrative Interpretation: no evidence of acute ischemia  ____________________________________________  RADIOLOGY  Chest x-ray interpreted by me shows no focal infiltrate or edema  ____________________________________________   PROCEDURES  Procedure(s) performed: No  Procedures  Critical Care performed: No ____________________________________________   INITIAL IMPRESSION / ASSESSMENT AND PLAN / ED COURSE  Pertinent labs & imaging results that were available during my care of the patient were reviewed by me and considered in my medical decision making (see chart for details).  55 year old male with PMH as noted above presents with intermittent substernal chest pain which awoke him from sleep and is atypical and nonexertional.  He reports similar pain once previously which he was told was related to a hiatal hernia.  He has hypertension but no known cardiac history.  On exam, the patient is overall well-appearing.  His vital signs are normal except for hypertension.  The physical exam is otherwise unremarkable for acute findings.  Overall presentation is most consistent with GERD versus musculoskeletal  pain or other benign etiology.  I have a low suspicion for ACS.  The patient's EKG is nonischemic (initial EKG was read by the machine as acute MI, however this was due to poor quality baseline; repeat done moments later showed no acute abnormality).  The presentation is not consistent with PE, aortic dissection or other vascular cause given the reassuring vital signs and intermittent nature of the pain.  He is status post cholecystectomy.  The pain is all in his chest rather than his abdomen so I do not suspect hepatobiliary cause.  We will obtain basic labs, cardiac enzymes x2, chest x-ray, and reassess.  I will also give a GI cocktail.  ----------------------------------------- 2:08 PM on 04/10/2020 -----------------------------------------  The patient reports significantly improved pain after the GI meds.  He has relatively mild residual pain but appears comfortable.  Overall the presentation is most consistent with reflux given his history of hiatal hernia and prior episodes of similar pain.  He is not currently on any medications for GERD.  At this time, the patient is stable for discharge.  I counseled him on the results of the work-up.  Return precautions given, and he expresses understanding.  ____________________________________________   FINAL  CLINICAL IMPRESSION(S) / ED DIAGNOSES  Final diagnoses:  Atypical chest pain  Gastroesophageal reflux disease, unspecified whether esophagitis present      NEW MEDICATIONS STARTED DURING THIS VISIT:  New Prescriptions   No medications on file     Note:  This document was prepared using Dragon voice recognition software and may include unintentional dictation errors.   Dionne Bucy, MD 04/10/20 1410

## 2020-04-10 NOTE — ED Triage Notes (Signed)
pt arrives via ems from home. c/o CP x 7 hours starting at 0300, waking pt from sleep. reports pain substernal, non radiating. sharp stabing 8/10 at 0300. pt took 325mg  asprin and went back to sleep. 0600 took another 325 asprin and one sublingual nitro. pt called ems for 2/10 CP pain, non radiating, sharp stabing. pains similar to this since having gallbladder removed. Pt also c/o right upper abd pain, tender with palpation, back pain that increases with deep breathing between shoulder blades. Pt last visit to PCP was 3 months ago. nitro taken by pt was fathers medication and not pts. Pt a&o x 4 on arrival NAD noted.     ems vitals  cbg 130 t-98.7 BP 173/92 HR 84  97% RA

## 2020-04-10 NOTE — Discharge Instructions (Addendum)
Take the Prilosec daily as prescribed at least for the next several weeks.  You may take the Carafate if needed on top of this if you have an episode of the pain especially after eating.  Return to the ER for new, worsening, or persistent severe pain, nausea or vomiting, difficulty breathing, weakness or lightheadedness, or any other new or worsening symptoms that concern you.  Follow-up with your primary care doctor in the next few weeks.

## 2020-07-03 ENCOUNTER — Other Ambulatory Visit: Payer: Self-pay

## 2020-07-09 ENCOUNTER — Other Ambulatory Visit: Payer: Self-pay

## 2020-07-10 ENCOUNTER — Other Ambulatory Visit: Payer: Self-pay

## 2020-07-10 MED ORDER — CITALOPRAM HYDROBROMIDE 40 MG PO TABS
ORAL_TABLET | ORAL | 0 refills | Status: DC
  Filled 2020-07-10: qty 30, 30d supply, fill #0

## 2020-07-10 MED ORDER — CARBAMAZEPINE 200 MG PO TABS
ORAL_TABLET | ORAL | 0 refills | Status: DC
  Filled 2020-07-10: qty 90, 30d supply, fill #0

## 2020-07-22 ENCOUNTER — Other Ambulatory Visit: Payer: Self-pay

## 2020-07-22 ENCOUNTER — Ambulatory Visit: Payer: Medicaid Other | Admitting: Pharmacy Technician

## 2020-07-22 DIAGNOSIS — Z79899 Other long term (current) drug therapy: Secondary | ICD-10-CM

## 2020-07-22 NOTE — Progress Notes (Signed)
Completed [facility] application and contract.  Patient agreed to all terms of the [facility] contract.    Patient approved to receive medication assistance at [facility] unitl time for re-certification, and as long as eligibility criteria continues to be met.    Provided patient with Civil engineer, contracting based on his particular needs.    [facility]

## 2020-08-03 ENCOUNTER — Other Ambulatory Visit: Payer: Self-pay

## 2020-08-11 ENCOUNTER — Other Ambulatory Visit: Payer: Self-pay

## 2020-08-11 MED ORDER — CARBAMAZEPINE 200 MG PO TABS
ORAL_TABLET | ORAL | 0 refills | Status: DC
  Filled 2020-08-11: qty 60, 20d supply, fill #0
  Filled 2020-08-26: qty 30, 10d supply, fill #1
  Filled ????-??-??: fill #1

## 2020-08-11 MED ORDER — CITALOPRAM HYDROBROMIDE 40 MG PO TABS
ORAL_TABLET | ORAL | 0 refills | Status: DC
  Filled 2020-08-11: qty 30, 30d supply, fill #0

## 2020-08-12 ENCOUNTER — Other Ambulatory Visit: Payer: Self-pay

## 2020-08-12 MED ORDER — CITALOPRAM HYDROBROMIDE 40 MG PO TABS
ORAL_TABLET | ORAL | 2 refills | Status: DC
  Filled 2020-09-09: qty 30, 30d supply, fill #0
  Filled 2020-10-08: qty 30, 30d supply, fill #1
  Filled 2020-11-18: qty 30, 30d supply, fill #2

## 2020-08-12 MED ORDER — CARBAMAZEPINE 200 MG PO TABS
ORAL_TABLET | ORAL | 2 refills | Status: DC
  Filled 2020-09-09: qty 90, 30d supply, fill #0
  Filled 2020-10-08: qty 90, 30d supply, fill #1
  Filled 2020-11-18: qty 90, 30d supply, fill #2

## 2020-08-13 ENCOUNTER — Other Ambulatory Visit: Payer: Self-pay

## 2020-08-25 ENCOUNTER — Ambulatory Visit: Payer: Medicaid Other | Admitting: Pharmacist

## 2020-08-25 ENCOUNTER — Encounter (INDEPENDENT_AMBULATORY_CARE_PROVIDER_SITE_OTHER): Payer: Self-pay

## 2020-08-25 ENCOUNTER — Encounter: Payer: Self-pay | Admitting: Pharmacist

## 2020-08-25 ENCOUNTER — Other Ambulatory Visit: Payer: Self-pay

## 2020-08-25 DIAGNOSIS — Z79899 Other long term (current) drug therapy: Secondary | ICD-10-CM

## 2020-08-25 NOTE — Progress Notes (Addendum)
[facility] Visit Note  Patient: Collin Wilson MRN: [id number] Date of Birth: 08/29/1965 PCP: [facility]   Zada Finders 55 y.o. male presents in good spirits for an initial medication therapy management visit today via telephone. Pt was verified by name and DOB.   There were no vitals taken for this visit.  Patient Information   Past Medical History:  Diagnosis Date   Bipolar 1 disorder (HCC)    Hypertension    Suicide attempt [facility])       Past Surgical History:  Procedure Laterality Date   LEG SURGERY      History reviewed. No pertinent family history.  New Diagnoses (since last visit):   Lifestyle Diet: Watches what he eats due to PMH of diverticulosis.  Exercise: was not evaluated at today's visit. Followed by Physical Therapy and states pain levels range depending on factors such as the position he sleeps in, etc. Pt has chronic hip and knee pain.   Social History   Substance and Sexual Activity  Alcohol Use No      Social History   Tobacco Use  Smoking Status Never  Smokeless Tobacco Never      Health Maintenance  Topic Date Due   Pneumococcal Vaccine 82-71 Years old (1 - PCV) Never done   HIV Screening  Never done   Hepatitis C Screening  Never done   Zoster Vaccines- Shingrix (1 of 2) Never done   COLONOSCOPY (Pts 45-39yrs Insurance coverage will need to be confirmed)  Never done   COVID-19 Vaccine (3 - Pfizer risk series) 08/24/2020   INFLUENZA VACCINE  09/07/2020   TETANUS/TDAP  06/02/2023   HPV VACCINES  Aged Out   Health Maintenance/Date Completed  Last ED visit: 07/01/2020 - COVID-19 infection Last Visit to PCP: 07/22/20 Next Visit to PCP: 11/24/2020 Dr. Payton Mccallum  Specialist Visit: last visit was 07/27/20 with physical therapy - Verneita Griffes  Dental Exam: not recently  Eye Exam: no but would like to see the eye doctor, would like a referral Prostate Exam: not recent  Colonoscopy:  unsure  Flu Vaccine: not sure  Pneumonia Vaccine: never  COVID-19 Vaccine: 07/27/20 2 doses of pfizer  Shingrix Vaccine: not sure   Outpatient Encounter Medications as of 08/25/2020  Medication Sig   atorvastatin (LIPITOR) 10 MG tablet Take 10 mg by mouth daily. Take 1 tablet by mouth daily in the morning   [START ON 09/04/2020] carbamazepine (TEGRETOL) 200 MG tablet TAKE ONE TABLET BY MOUTH THREE TIMES DAILY.   [START ON 09/04/2020] citalopram (CELEXA) 40 MG tablet TAKE ONE TABLET BY MOUTH ONCE DAILY IN THE MORNING.   lisinopril (ZESTRIL) 20 MG tablet Take 20 mg by mouth daily.   [DISCONTINUED] carbamazepine (TEGRETOL) 200 MG tablet TAKE ONE TABLET BY MOUTH THREE TIMES DAILY.   [DISCONTINUED] atenolol (TENORMIN) 25 MG tablet Take 0.5 tablets (12.5 mg total) by mouth daily. (Patient not taking: Reported on 08/25/2020)   [DISCONTINUED] carbamazepine (TEGRETOL) 200 MG tablet Take 1 tablet (200 mg total) by mouth 2 (two) times daily at 8 am and 10 pm. (Patient not taking: Reported on 08/25/2020)   [DISCONTINUED] carbamazepine (TEGRETOL) 200 MG tablet TAKE ONE TABLET (200MG  TOTAL) BY MOUTH THREE TIMES DAILY.   [DISCONTINUED] citalopram (CELEXA) 40 MG tablet Take 1 tablet (40 mg total) by mouth daily. (Patient not taking: Reported on 08/25/2020)   [DISCONTINUED] citalopram (CELEXA) 40 MG tablet TAKE ONE TABLET (40MG  TOTAL) BY MOUTH ONCE DAILY IN THE MORNING.   [  DISCONTINUED] citalopram (CELEXA) 40 MG tablet TAKE ONE TABLET BY MOUTH ONCE DAILY IN THE MORNING. (Patient not taking: Reported on 08/25/2020)   [DISCONTINUED] omeprazole (PRILOSEC) 40 MG capsule Take 1 capsule (40 mg total) by mouth daily.   [DISCONTINUED] sucralfate (CARAFATE) 1 g tablet Take 1 tablet (1 g total) by mouth 4 (four) times daily as needed (reflux).   [DISCONTINUED] traZODone (DESYREL) 100 MG tablet Take 1 tablet (100 mg total) by mouth at bedtime. (Patient not taking: Reported on 08/25/2020)   No facility-administered encounter  medications on file as of 08/25/2020.    Assessment and Plan:  Medication Management/Adherence:  Pt reports adherence to medications. Only concern for today is a refill from [facility] for Carbamazepine. Medication needs are being taken care of by [facility] and [facility] via charity care.   Mental Health:  Current regimen consists of citalopram and carbamazepine. Pt is knowledgeable and appears to be invested in his mental health. Pt is receiving medication from [facility]. Followed by: [facility] per pt.   Hypertension:  Currently on lisinopril. Pt reports he checks BP at home when he feels bad. BP ranges: 140s/100s. States dizziness and headaches have improved since starting medication. Pt endorses he would like a referral for opthalmology from PCP. Goal BP <130/80 mmHg per Dr.Conty is consistent with HTN guidelines. Pt is receiving medication from [facility] through charity care. Followed by: Payton Mccallum, MD   Hyperlipidemia:  Currently on atorvastatin. Lipid panel on 07/20/20: LDL 145, TG 185, Chol 227. Pt is receiving medication from [facility] through charity care. Followed by: Payton Mccallum, MD.    RTC: 1 year  M. [place] Khamron Gellert, [place] PharmD Candidate [phone]  [employer]  Cosigned: Iona Beard. Joelene Millin, PharmD [facility]-Pharmacy Operations Coordinator [phone]

## 2020-08-26 ENCOUNTER — Other Ambulatory Visit: Payer: Self-pay

## 2020-08-28 ENCOUNTER — Other Ambulatory Visit: Payer: Self-pay

## 2020-09-09 ENCOUNTER — Other Ambulatory Visit: Payer: Self-pay

## 2020-09-21 ENCOUNTER — Other Ambulatory Visit: Payer: Self-pay

## 2020-10-09 ENCOUNTER — Other Ambulatory Visit: Payer: Self-pay

## 2020-11-17 ENCOUNTER — Other Ambulatory Visit: Payer: Self-pay

## 2020-11-18 ENCOUNTER — Other Ambulatory Visit: Payer: Self-pay

## 2020-11-18 MED ORDER — CARBAMAZEPINE 200 MG PO TABS
ORAL_TABLET | ORAL | 2 refills | Status: DC
  Filled 2021-01-08: qty 90, 30d supply, fill #0
  Filled 2021-02-10: qty 90, 30d supply, fill #1

## 2020-11-18 MED ORDER — CITALOPRAM HYDROBROMIDE 40 MG PO TABS
ORAL_TABLET | ORAL | 2 refills | Status: DC
  Filled 2021-01-07: qty 30, 30d supply, fill #0
  Filled 2021-02-10: qty 30, 30d supply, fill #1

## 2021-01-07 ENCOUNTER — Other Ambulatory Visit: Payer: Self-pay

## 2021-01-08 ENCOUNTER — Other Ambulatory Visit: Payer: Self-pay

## 2021-01-11 ENCOUNTER — Other Ambulatory Visit: Payer: Self-pay

## 2021-02-10 ENCOUNTER — Other Ambulatory Visit: Payer: Self-pay

## 2021-02-10 MED ORDER — CARBAMAZEPINE 200 MG PO TABS
ORAL_TABLET | ORAL | 2 refills | Status: AC
  Filled 2021-02-10: qty 90, 30d supply, fill #0

## 2021-02-10 MED ORDER — CITALOPRAM HYDROBROMIDE 40 MG PO TABS
ORAL_TABLET | ORAL | 2 refills | Status: AC
  Filled 2021-02-10: qty 30, 30d supply, fill #0

## 2021-02-15 ENCOUNTER — Telehealth: Payer: Self-pay | Admitting: Pharmacy Technician

## 2021-02-15 NOTE — Telephone Encounter (Signed)
Patient stated that he will have a Medicare Part D plan beginning 03/10/21.  Explained that patient would no longer be eligible to receive medication assistance.  Patient acknowledged that he understood.  Patient has not decided on a pharmacy that he plans to use.  Patient will call [facility] to inform where to transfer prescriptions once he decides where he wants to fill his prescriptions.  Sherilyn Dacosta Care Manager [facility]

## 2021-02-16 ENCOUNTER — Other Ambulatory Visit: Payer: Self-pay

## 2021-02-18 ENCOUNTER — Other Ambulatory Visit: Payer: Self-pay

## 2021-03-18 ENCOUNTER — Other Ambulatory Visit: Payer: Self-pay

## 2021-03-23 ENCOUNTER — Other Ambulatory Visit: Payer: Self-pay

## 2023-02-22 IMAGING — CR DG CHEST 2V
2 series · 2 of 2 positions shown · non-contrast
Comparison: 11/05/2005

CLINICAL DATA: Chest pain

EXAM:
CHEST - 2 VIEW

[chest pa]
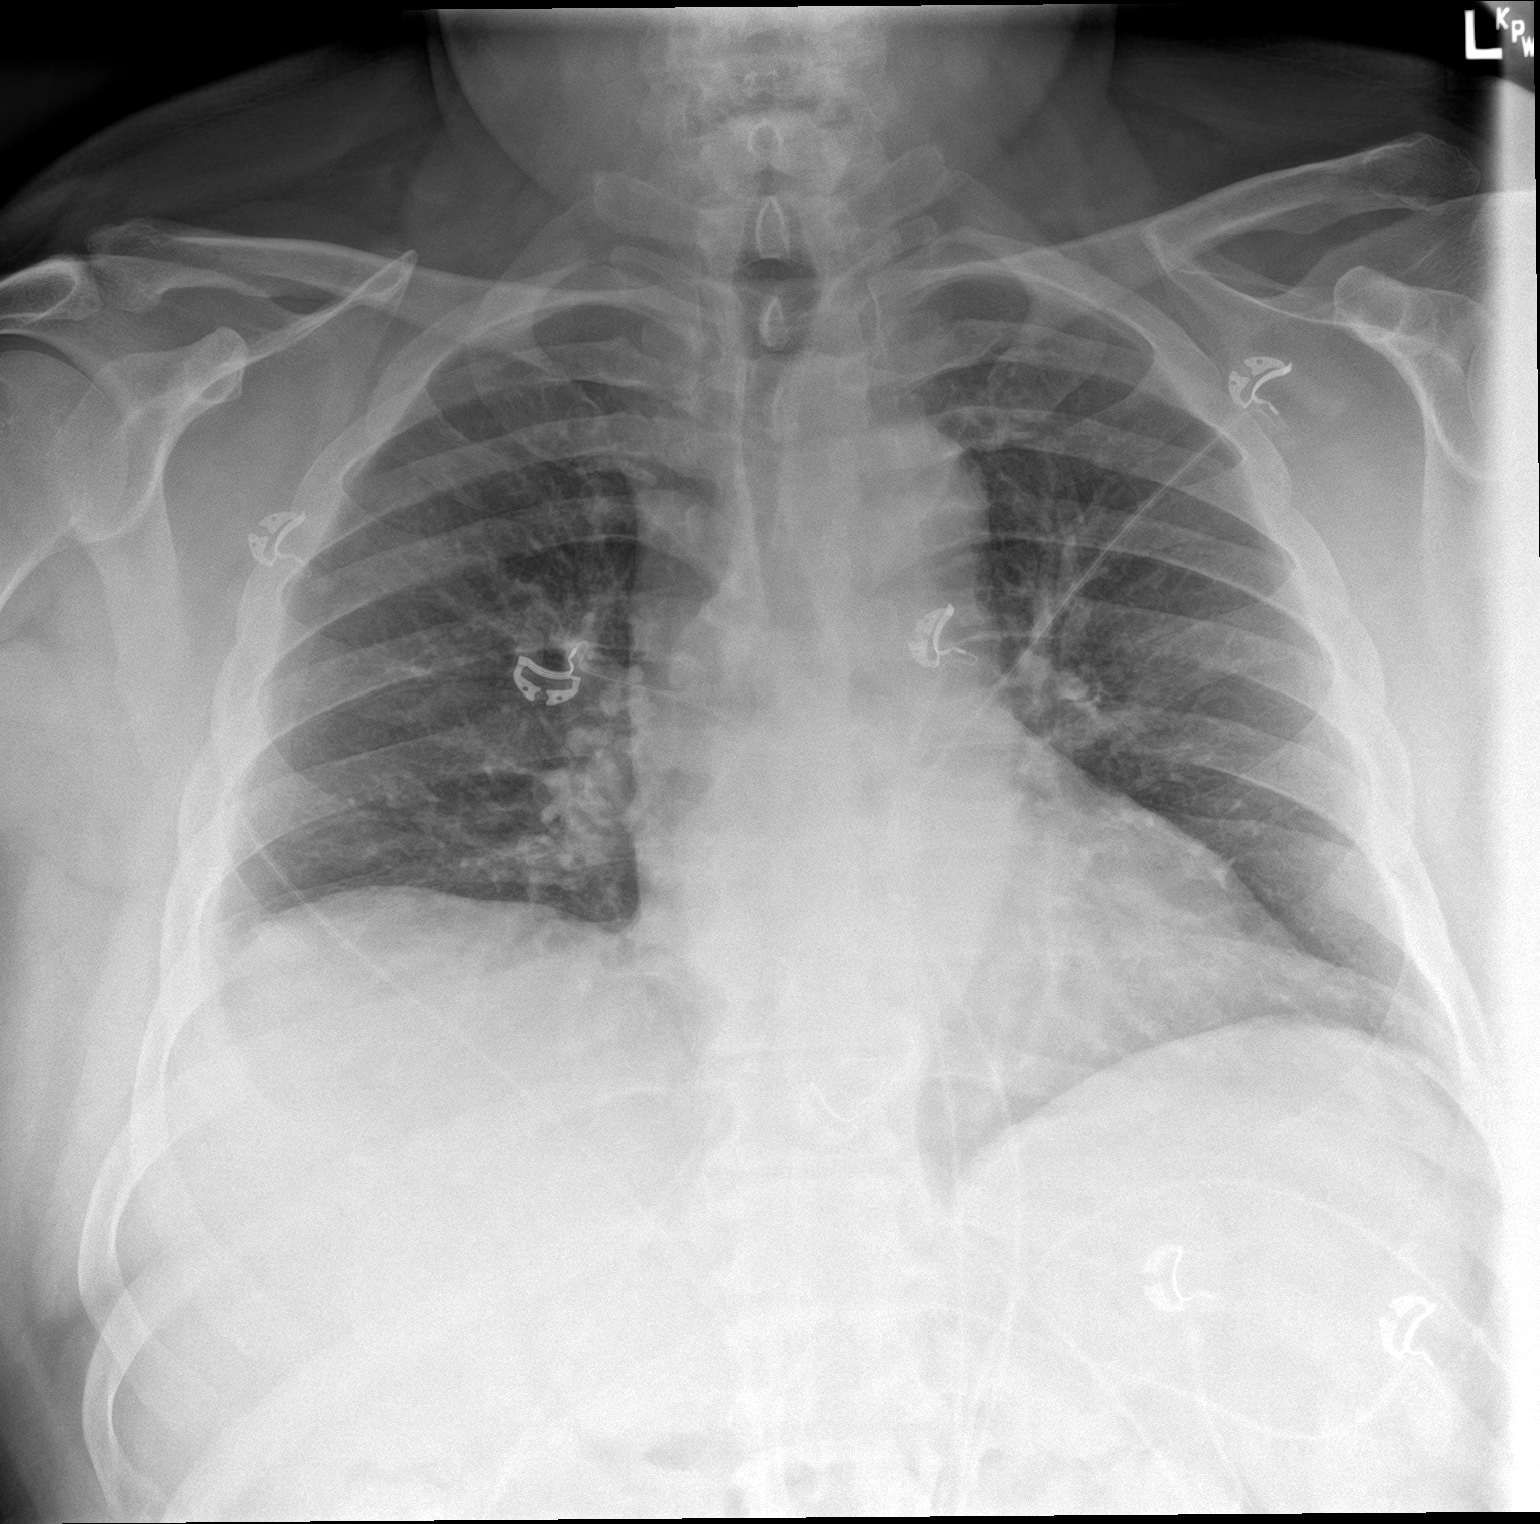

[chest lat]
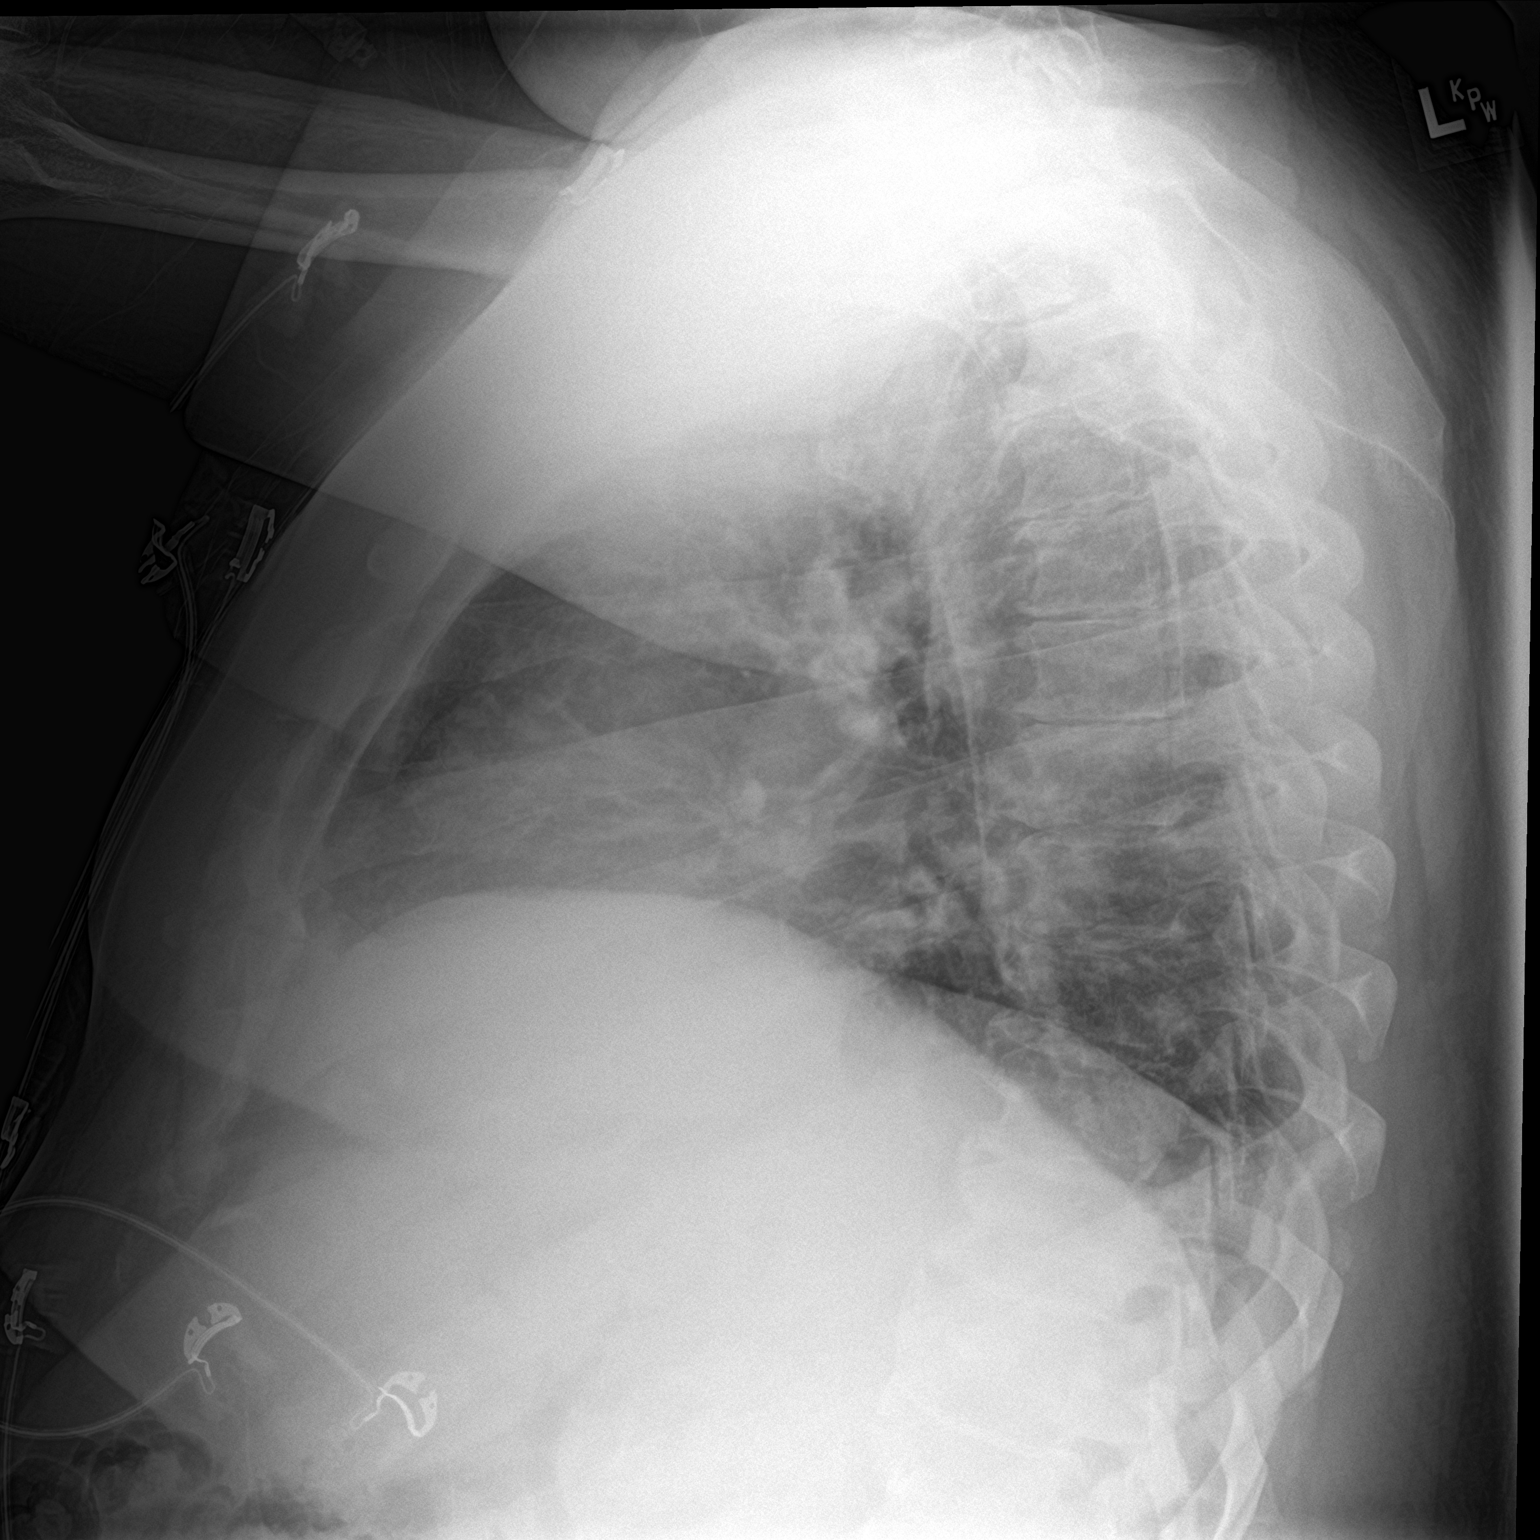

[2 of 2 positions shown; findings below may reference images not displayed]

FINDINGS: Heart and mediastinal contours are within normal limits. No focal
opacities or effusions. No acute bony abnormality.
IMPRESSION: No active cardiopulmonary disease.
